# Patient Record
Sex: Male | Born: 2000
Health system: Southern US, Community
[De-identification: ages and names within clinical notes are randomized; demographics above are authoritative.]

## PROBLEM LIST (undated history)

## (undated) DIAGNOSIS — J45909 Unspecified asthma, uncomplicated: Secondary | ICD-10-CM

## (undated) DIAGNOSIS — Z87820 Personal history of traumatic brain injury: Secondary | ICD-10-CM

---
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
## 2019-03-27 DIAGNOSIS — M7711 Lateral epicondylitis, right elbow: Secondary | ICD-10-CM | POA: Diagnosis not present

## 2019-03-27 DIAGNOSIS — Z68.41 Body mass index (BMI) pediatric, less than 5th percentile for age: Secondary | ICD-10-CM | POA: Diagnosis not present

## 2019-03-27 DIAGNOSIS — M779 Enthesopathy, unspecified: Secondary | ICD-10-CM | POA: Diagnosis not present

## 2019-04-01 IMAGING — CT CT CERVICAL SPINE W/O CM
4 of 7 series · 15 of 33 positions shown, 16 images · non-contrast
Comparison: Prior CT from 09/27/2016.

CLINICAL DATA: Initial evaluation for acute trauma, fall.

EXAM:
CT HEAD WITHOUT CONTRAST
CT CERVICAL SPINE WITHOUT CONTRAST
TECHNIQUE: Multidetector CT imaging of the head and cervical spine was
performed following the standard protocol without intravenous
contrast. Multiplanar CT image reconstructions of the cervical spine
were also generated.

[Series 4: coronal soft tissue · coronal · 0.31mm/px · 3 of 80 slices shown]
[im 20/80  bone]
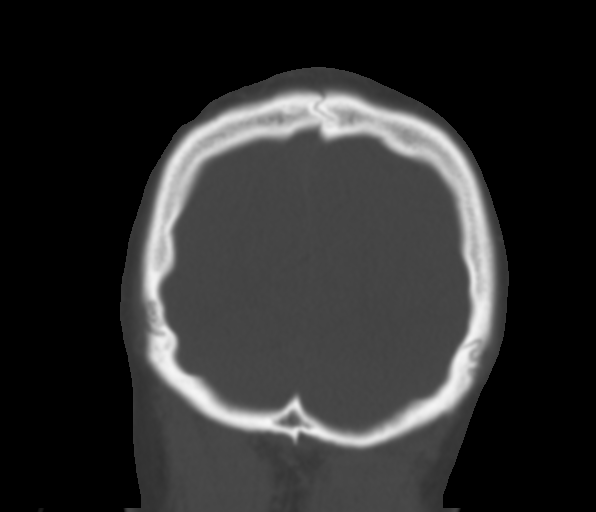
[im 40/80  bone]
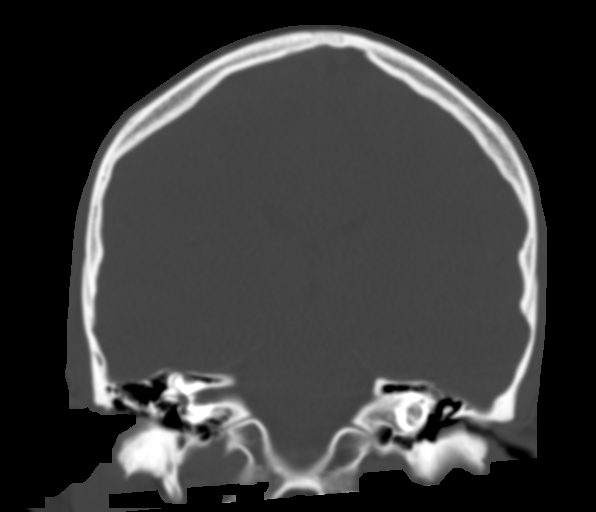
[im 60/80  bone]
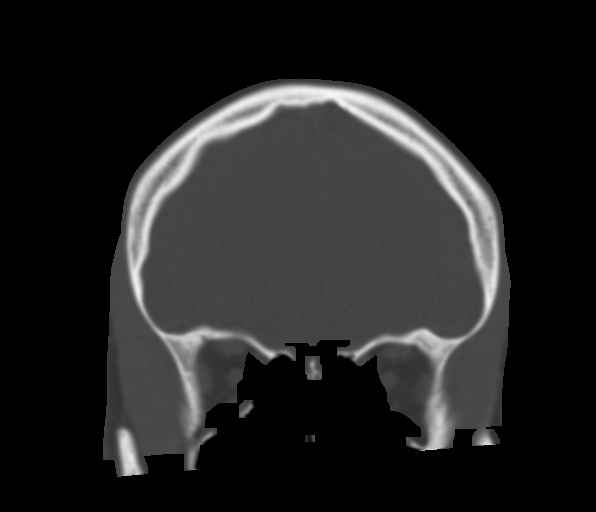

[Series 7: c spine soft · axial · 0.30mm/px · z∈[-122,-6]mm · 4 of 98 slices shown]
[im 20/98  soft-tissue]
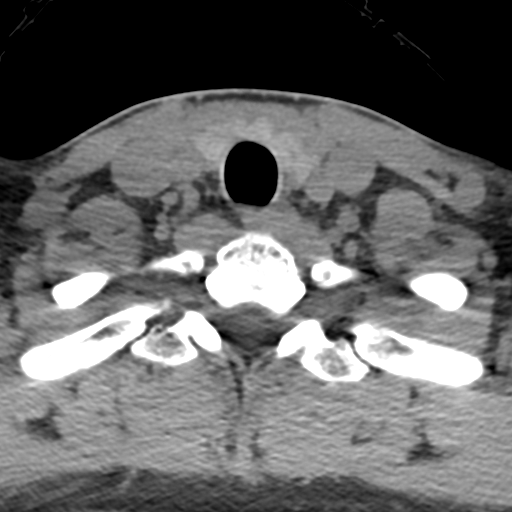
[im 39/98  soft-tissue]
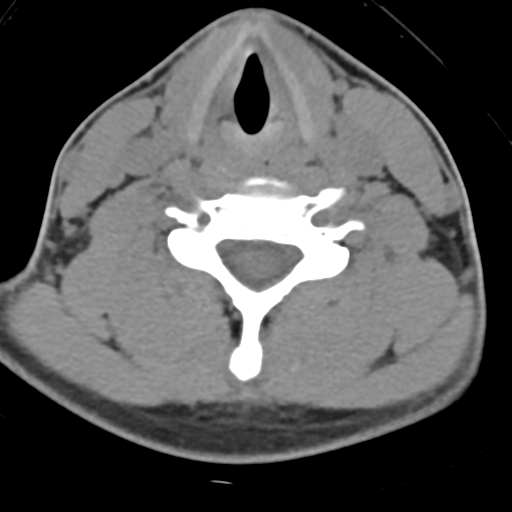
[im 59/98  soft-tissue]
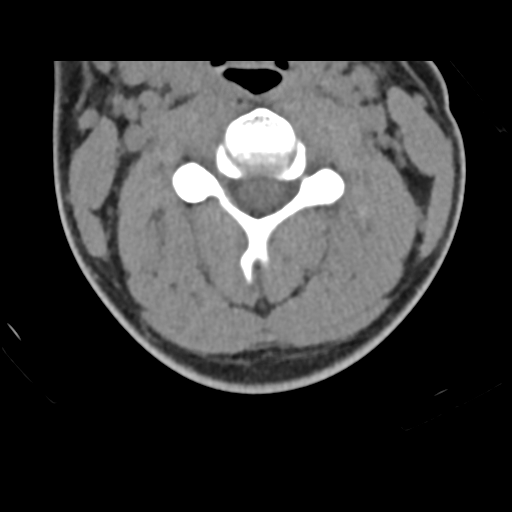
[im 78/98  soft-tissue]
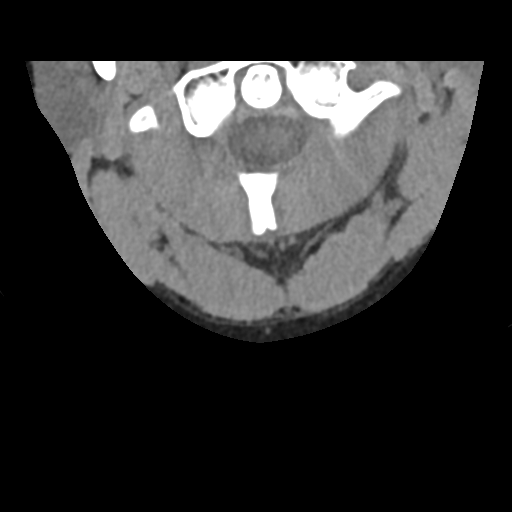

[Series 8: sagittal bone · sagittal · 0.38mm/px · 4 of 61 slices shown]
[im 13/61  bone]
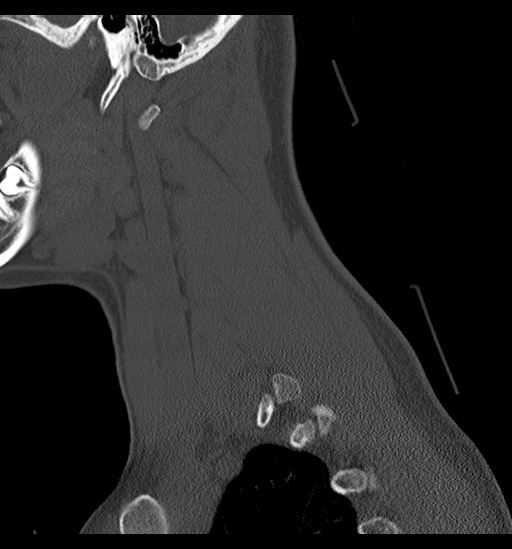
[im 25/61  bone]
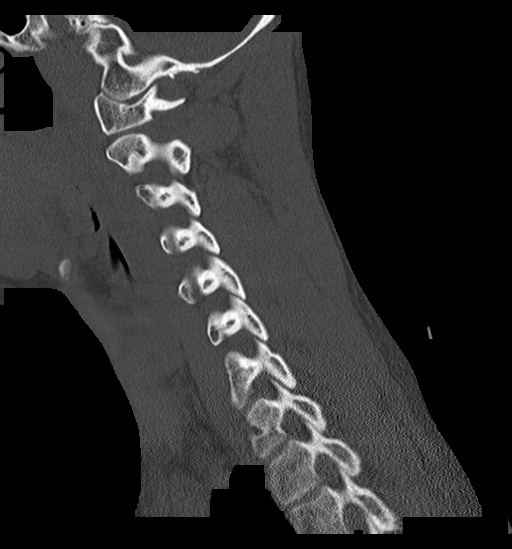
[im 37/61  bone]
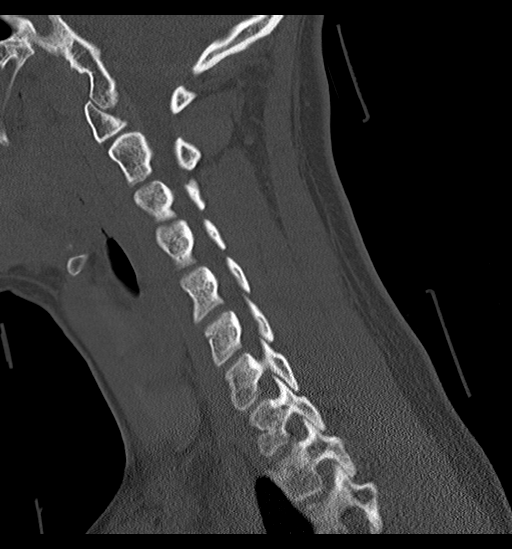
[im 49/61  bone]
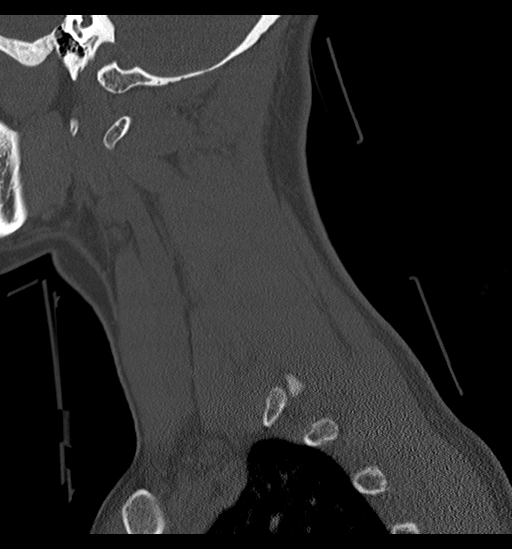

[Series 13: orthogonal bone · axial · 0.21mm/px · z∈[-139,-33]mm · 4 of 102 slices shown, 5 images]
[im 21/102  soft-tissue]
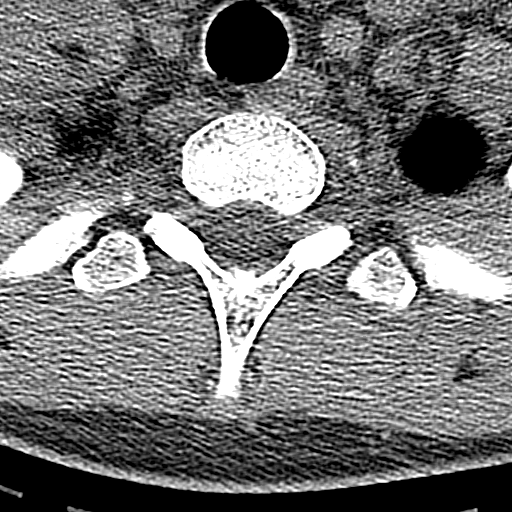
[im 21/102  bone]
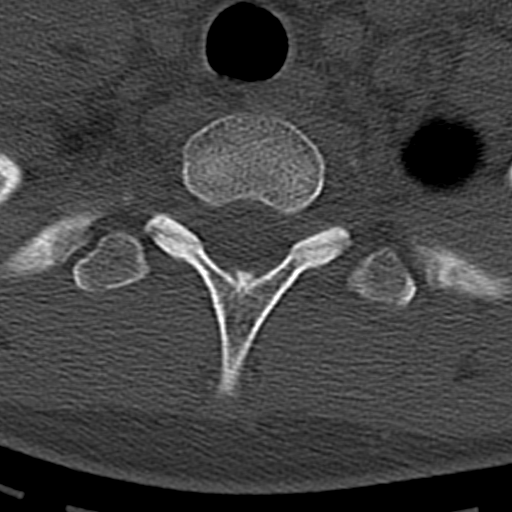
[im 41/102  bone]
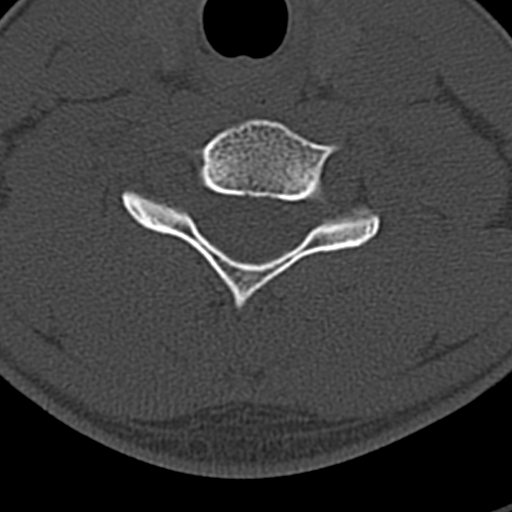
[im 61/102  bone]
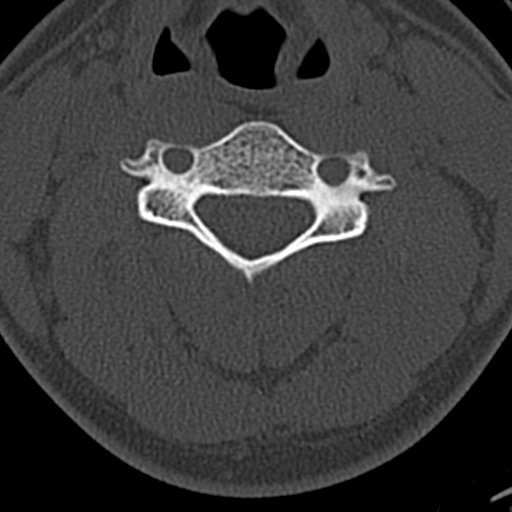
[im 81/102  bone]
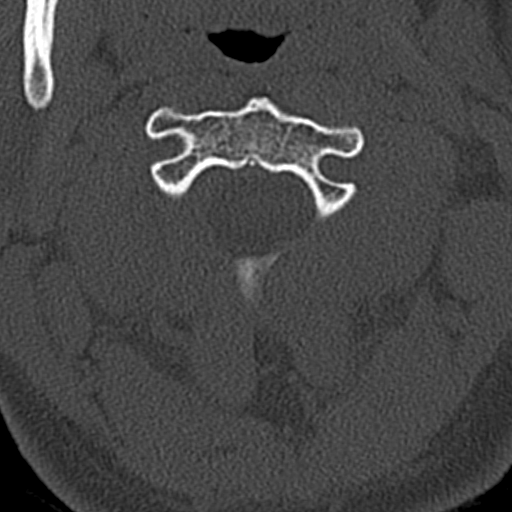

[15 of 33 positions shown; findings below may reference images not displayed]

FINDINGS: CT HEAD FINDINGS

Brain: Cerebral volume within normal limits for patient age.

No evidence for acute intracranial hemorrhage. No findings to
suggest acute large vessel territory infarct. No mass lesion,
midline shift, or mass effect. Ventricles are normal in size without
evidence for hydrocephalus. No extra-axial fluid collection
identified.

Vascular: No hyperdense vessel identified.

Skull: Scalp soft tissues demonstrate no acute abnormality.Calvarium
intact.

Sinuses/Orbits: Globes and orbital soft tissues are within normal
limits.

Visualized paranasal sinuses are clear. No mastoid effusion.

CT CERVICAL SPINE FINDINGS

Alignment: Straightening of the normal cervical lordosis. No
listhesis.

Skull base and vertebrae: Skullbase intact. Normal C1-2
articulations preserved. Dens is intact. Vertebral body heights
maintained. No acute fracture.

Soft tissues and spinal canal: Visualized soft tissues of the neck
demonstrate no acute abnormality. No prevertebral edema.

Disc levels: No significant degenerative changes within the cervical
spine.

Upper chest: Visualized upper chest is unremarkable. Visualized lung
apices are clear. No apical pneumothorax.

Other: No other significant finding.
IMPRESSION: 1. Normal head CT.  No acute intracranial process identified.
2. No acute traumatic injury within the cervical spine.
3. Straightening of the normal cervical lordosis, which may be
related to positioning and/or muscular spasm.

## 2019-06-20 ENCOUNTER — Other Ambulatory Visit: Payer: Self-pay

## 2019-06-20 DIAGNOSIS — Z20822 Contact with and (suspected) exposure to covid-19: Secondary | ICD-10-CM

## 2019-06-21 LAB — NOVEL CORONAVIRUS, NAA: SARS-CoV-2, NAA: NOT DETECTED

## 2019-06-22 ENCOUNTER — Telehealth: Payer: Self-pay | Admitting: *Deleted

## 2019-06-22 NOTE — Telephone Encounter (Signed)
Patient called and was given NEGATIVE COVID results . 

## 2019-08-05 ENCOUNTER — Other Ambulatory Visit: Payer: Self-pay

## 2019-08-05 ENCOUNTER — Ambulatory Visit
Admission: EM | Admit: 2019-08-05 | Discharge: 2019-08-05 | Disposition: A | Discharge status: Home / Self Care | Payer: BC Managed Care – PPO | Attending: Emergency Medicine | Admitting: Emergency Medicine

## 2019-08-05 ENCOUNTER — Ambulatory Visit (INDEPENDENT_AMBULATORY_CARE_PROVIDER_SITE_OTHER): Payer: BC Managed Care – PPO

## 2019-08-05 DIAGNOSIS — S82001A Unspecified fracture of right patella, initial encounter for closed fracture: Secondary | ICD-10-CM

## 2019-08-05 DIAGNOSIS — M25561 Pain in right knee: Secondary | ICD-10-CM

## 2019-08-05 MED ORDER — IBUPROFEN 800 MG PO TABS: 800.0000 mg | ORAL_TABLET | Freq: Three times a day (TID) | ORAL | 0 refills | Status: DC

## 2019-08-05 NOTE — ED Triage Notes (Signed)
Pt has chronic pain in right knee but jumped out of trick 3 days ago and heard pop, pian has worsened, some swelling noted

## 2019-08-05 NOTE — ED Provider Notes (Addendum)
[facility]    CSN: [id number] Arrival date & time: 08/05/19  1303      History   Chief Complaint Chief Complaint  Patient presents with  . Knee Injury    HPI Nicolas George is a 19 y.o. male.   Nicolas George 19 years old male presented to the urgent care with a complaint of right knee pain that began 3 days ago.  It develop gradually after jumping from a truck.  He localizes the pain to the right knee.  He describes the pain as constant and achy and rated at 4 on a scale of 1-10. Pain is at 5 or 6 on a range of motion. He has tried OTC tylenol medications without relief.  His symptoms are made worse with movement .  He denies similar symptoms in the past.    The history is provided by the patient. No language interpreter was used.    Past Medical History:  Diagnosis Date  . H/O multiple concussions     Patient Active Problem List   Diagnosis Date Noted  . Persistent headaches 11/08/2016  . Postconcussion syndrome 07/06/2016    History reviewed. No pertinent surgical history.     Home Medications    Prior to Admission medications   Medication Sig Start Date End Date Taking? Authorizing Provider  ibuprofen (ADVIL) 800 MG tablet Take 1 tablet (800 mg total) by mouth 3 (three) times daily. Take with food 08/05/19   Rissa Turley, Darrelyn Hillock, FNP  loratadine (ALLERGY RELIEF) 10 MG tablet Take 10 mg by mouth daily as needed for allergies.    [provider]  methocarbamol (ROBAXIN) 500 MG tablet Take 1 tablet (500 mg total) by mouth 3 (three) times daily. 12/21/17   Lily Kocher, PA-C  naproxen sodium (ALEVE) 220 MG tablet Take 220 mg by mouth daily as needed (FOR PAIN).    [provider]  PROAIR HFA 108 [phone] Base) MCG/ACT inhaler Inhale 1-2 puffs into the lungs every 6 (six) hours as needed for wheezing or shortness of breath.  12/08/17   [provider]  traMADol Veatrice Bourbon) 50 MG tablet 1 or 2 po q6h prn pain 12/21/17   Lily Kocher, PA-C     Family History Family History  Problem Relation Age of Onset  . Colon cancer Maternal Grandfather   . Diabetes Paternal Grandfather   . Lymphoma Brother     Social History Social History   Tobacco Use  . Smoking status: Never Smoker  . Smokeless tobacco: Never Used  Substance Use Topics  . Alcohol use: No    Alcohol/week: 0.0 standard drinks  . Drug use: No     Allergies   Patient has no known allergies.   Review of Systems Review of Systems  Constitutional: Negative.   Respiratory: Negative.   Cardiovascular: Negative.   Musculoskeletal: Negative.        Knee pain  Neurological: Negative.      Physical Exam Triage Vital Signs ED Triage Vitals  Enc Vitals Group     BP      Pulse      Resp      Temp      Temp src      SpO2      Weight      Height      Head Circumference      Peak Flow      Pain Score      Pain Loc      Pain  Edu?      Excl. in GC?    No data found.  Updated Vital Signs Pulse 69   Temp 99 F (37.2 C)   Resp 18   SpO2 96%   Visual Acuity Right Eye Distance:   Left Eye Distance:   Bilateral Distance:    Right Eye Near:   Left Eye Near:    Bilateral Near:     Physical Exam Vitals and nursing note reviewed.  Constitutional:      General: He is not in acute distress.    Appearance: He is normal weight. He is not ill-appearing or toxic-appearing.  Cardiovascular:     Rate and Rhythm: Normal rate and regular rhythm.     Pulses: Normal pulses.     Heart sounds: Normal heart sounds.  Pulmonary:     Effort: Pulmonary effort is normal. No respiratory distress.     Breath sounds: Normal breath sounds. No rhonchi.  Chest:     Chest wall: No tenderness.  Musculoskeletal:        General: Tenderness present. No swelling or signs of injury.     Right knee: No swelling, deformity, effusion, erythema or crepitus. Tenderness present. Normal alignment. Normal pulse.     Left knee: Normal.     Comments: Patient is able to bear  weight and ambulate with pain.  No surface trauma or obvious effusion.  No overlying erythema or warmth.  The right knee is without obvious asymmetry or deformity when compared to the left knee.  Patient is unable to deep knee bend.  No quadricep tenderness.  Anterior drawer test, Lachman test were negative.  Neurological:     Mental Status: He is alert.      UC Treatments / Results  Labs (all labs ordered are listed, but only abnormal results are displayed) Labs Reviewed - No data to display  EKG   Radiology DG Knee Complete 4 Views Right  Result Date: 08/05/2019 CLINICAL DATA:  Right knee pain and swelling EXAM: RIGHT KNEE - COMPLETE 4+ VIEW COMPARISON:  08/08/2015 FINDINGS: Subtle linear lucency of the cortex of the inferomedial patella seen only on oblique view potentially representing a nondisplaced fracture. Osseous structures appear otherwise intact and unremarkable. Suspect small knee joint effusion. IMPRESSION: 1. Possible subtle nondisplaced fracture of the inferomedial patella seen only on one view. Correlation with point tenderness at this site is recommended. 2. Suspect small knee joint effusion. Electronically Signed   By: Duanne Guess D.O.   On: 08/05/2019 13:42    Procedures Procedures (including critical care time)  Medications Ordered in UC Medications - No data to display  Initial Impression / Assessment and Plan / UC Course  I have reviewed the triage vital signs and the nursing notes.  Pertinent labs & imaging results that were available during my care of the patient were reviewed by me and considered in my medical decision making (see chart for details).   Right knee x-ray was ordered and result review.  Results show possible nondisplaced fracture of the inferior medial patella.  Advised patient to follow-up with orthopedic as soon as possible.  To go to ED for worsening of symptoms patient verbalized understanding of the plan of care.  Final Clinical  Impressions(s) / UC Diagnoses   Final diagnoses:  Closed nondisplaced fracture of right patella, unspecified fracture morphology, initial encounter     Discharge Instructions     Patient was advised to follow-up with orthopedic ASAP Advised to take ibuprofen with food  for pain Advised patient to follow RICE instruction To go to ED for worsening of symptoms    ED Prescriptions    Medication Sig Dispense Auth. Provider   ibuprofen (ADVIL) 800 MG tablet Take 1 tablet (800 mg total) by mouth 3 (three) times daily. Take with food 30 tablet Zenon Leaf, Zachery Dakins, FNP     PDMP not reviewed this encounter.   Durward Parcel, FNP 08/05/19 1416    Durward Parcel, FNP 08/05/19 [phone]

## 2019-08-05 NOTE — Discharge Instructions (Addendum)
Patient was advised to follow-up with orthopedic ASAP Advised to take ibuprofen with food for pain Advised patient to follow RICE instruction To go to ED for worsening of symptoms

## 2019-08-06 ENCOUNTER — Ambulatory Visit (INDEPENDENT_AMBULATORY_CARE_PROVIDER_SITE_OTHER): Payer: BC Managed Care – PPO | Admitting: Orthopedic Surgery

## 2019-08-06 ENCOUNTER — Encounter: Payer: Self-pay | Admitting: Orthopedic Surgery

## 2019-08-06 VITALS — BP 136/89 | HR 89 | Ht 71.0 in | Wt 185.0 lb

## 2019-08-06 DIAGNOSIS — S83241A Other tear of medial meniscus, current injury, right knee, initial encounter: Secondary | ICD-10-CM

## 2019-08-06 DIAGNOSIS — M25561 Pain in right knee: Secondary | ICD-10-CM | POA: Diagnosis not present

## 2019-08-06 DIAGNOSIS — W19XXXA Unspecified fall, initial encounter: Secondary | ICD-10-CM

## 2019-08-06 DIAGNOSIS — Y9239 Other specified sports and athletic area as the place of occurrence of the external cause: Secondary | ICD-10-CM | POA: Diagnosis not present

## 2019-08-06 DIAGNOSIS — S83411A Sprain of medial collateral ligament of right knee, initial encounter: Secondary | ICD-10-CM | POA: Diagnosis not present

## 2019-08-06 MED ORDER — IBUPROFEN 800 MG PO TABS: 800.0000 mg | ORAL_TABLET | Freq: Three times a day (TID) | ORAL | 1 refills | Status: DC | PRN

## 2019-08-06 NOTE — Progress Notes (Signed)
Chief Complaint  Patient presents with  . 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 felt a pop in his right knee on the medial side felt acute pain and then the pain worsened over the next couple days presented to urgent care for evaluation and treatment they did an x-ray and it was thought that he had a fractured patella as the imaging showed a possible linear fracture line  His pain is on the medial joint line and inferior to the patella  Review of systems is negative all systems were reviewed  He has a history of Osgood-Schlatter's disease at 19 years old but that resolved  No medical problems listed by the patient  BP 136/89   Pulse 89   Ht 5\' 11"  (1.803 m)   Wt 185 lb (83.9 kg)   BMI 25.80 kg/m   Awake and alert oriented x3 mood affect normal  Ambulating with crutches  Right knee Tenderness on the medial joint line Limited range of motion with poor and limited extension flexion to 90 degrees No joint effusion Extensor mechanism is intact he does have some patellar tendon tenderness McMurray's was positive Neurovascular exam is intact  Left knee skin normal range of motion normal strength normal ligaments normal neurovascular exam normal  Medical decision making   this is a new undiagnosed problem with uncertain prognosis  Independent interpretation of test and x-ray  Prescription  [facility] [place] notes are reviewed as well history confirmed radiographic report noted patient placed in a modified brace   Level 4 I reviewed the images from an outside facility MRI report is that there is a linear fracture line along the medial side which may suggest fracture or normal variant patella is otherwise centered for the remaining 4 views of the knee are normal  Impression normal right knee film  Meds ordered this encounter  Medications  . ibuprofen (ADVIL) 800 MG tablet     Sig: Take 1 tablet (800 mg total) by mouth every 8 (eight) hours as needed.    Dispense:  90 tablet    Refill:  1

## 2019-08-06 NOTE — Addendum Note (Signed)
Addended byCaffie Damme on: 08/06/2019 12:02 PM   Modules accepted: Orders

## 2019-08-06 NOTE — Patient Instructions (Signed)
Acute knee injury with possibilities of torn medial meniscus torn medial collateral ligament plus or minus patella fracture  Ice the knee 3 times a day for 20 minutes Continue with ibuprofen 800 mg 3 times a day You can add Tylenol 500 mg 4 times a day for pain  Wear the brace when you are walking you can remove it when you are sleeping you do not have to bathe in it   Out of work note next 2 weeks while MRI is being obtained and read

## 2019-08-07 ENCOUNTER — Ambulatory Visit
Admission: RE | Admit: 2019-08-07 | Discharge: 2019-08-07 | Disposition: A | Discharge status: Home / Self Care | Payer: BC Managed Care – PPO | Source: Ambulatory Visit | Attending: Orthopedic Surgery | Admitting: Orthopedic Surgery

## 2019-08-07 ENCOUNTER — Telehealth: Payer: Self-pay | Admitting: Orthopedic Surgery

## 2019-08-07 DIAGNOSIS — S83241A Other tear of medial meniscus, current injury, right knee, initial encounter: Secondary | ICD-10-CM

## 2019-08-07 DIAGNOSIS — S83411A Sprain of medial collateral ligament of right knee, initial encounter: Secondary | ICD-10-CM

## 2019-08-07 NOTE — Telephone Encounter (Signed)
Call from patient for MRI results of right knee, done at [facility]. Aware Dr Romeo Apple will call with results.

## 2019-08-08 ENCOUNTER — Telehealth: Payer: Self-pay | Admitting: Orthopedic Surgery

## 2019-08-08 NOTE — Telephone Encounter (Signed)
MRI negative  Patient says brace is helping his knee  Recommend appointment in 3 weeks

## 2019-08-08 NOTE — Telephone Encounter (Signed)
Done. Patient states he received his results.  Per message from Dr Romeo Apple: Vickki Hearing, MD    Make an appointment for 3 weeks recheck knee   - I have called patient; scheduled accordingly. Patient aware of appointment.

## 2019-08-10 ENCOUNTER — Other Ambulatory Visit: Payer: BC Managed Care – PPO

## 2019-08-29 ENCOUNTER — Other Ambulatory Visit: Payer: Self-pay

## 2019-08-29 ENCOUNTER — Encounter: Payer: Self-pay | Admitting: Orthopedic Surgery

## 2019-08-29 ENCOUNTER — Ambulatory Visit (INDEPENDENT_AMBULATORY_CARE_PROVIDER_SITE_OTHER): Payer: BC Managed Care – PPO | Admitting: Orthopedic Surgery

## 2019-08-29 VITALS — BP 123/83 | HR 67 | Ht 71.0 in | Wt 196.0 lb

## 2019-08-29 DIAGNOSIS — S83411A Sprain of medial collateral ligament of right knee, initial encounter: Secondary | ICD-10-CM

## 2019-08-29 NOTE — Progress Notes (Signed)
Chief Complaint  Patient presents with  . 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  He says he is gone back to work wants to play golf  Complains of no pain in the knee at this time  Examination reveals no effusion full range of motion stable ACL collateral ligaments no tenderness or effusion  Encounter Diagnosis  Name Primary?  . Sprain of medial collateral ligament of right knee, initial encounter Yes     Released

## 2019-10-10 DIAGNOSIS — H1013 Acute atopic conjunctivitis, bilateral: Secondary | ICD-10-CM | POA: Diagnosis not present

## 2019-11-05 ENCOUNTER — Other Ambulatory Visit: Payer: Self-pay

## 2019-11-05 ENCOUNTER — Other Ambulatory Visit: Payer: BC Managed Care – PPO

## 2019-11-05 ENCOUNTER — Ambulatory Visit: Payer: BC Managed Care – PPO | Attending: Internal Medicine

## 2019-11-05 DIAGNOSIS — Z20822 Contact with and (suspected) exposure to covid-19: Secondary | ICD-10-CM

## 2019-11-07 LAB — SARS-COV-2, NAA 2 DAY TAT

## 2019-11-07 LAB — NOVEL CORONAVIRUS, NAA: SARS-CoV-2, NAA: NOT DETECTED

## 2019-11-27 ENCOUNTER — Telehealth: Payer: BC Managed Care – PPO | Admitting: Internal Medicine

## 2019-12-03 ENCOUNTER — Ambulatory Visit: Payer: BC Managed Care – PPO | Admitting: Internal Medicine

## 2020-03-20 ENCOUNTER — Encounter: Payer: Self-pay | Admitting: Emergency Medicine

## 2020-03-20 ENCOUNTER — Other Ambulatory Visit: Payer: Self-pay

## 2020-03-20 ENCOUNTER — Ambulatory Visit
Admission: EM | Admit: 2020-03-20 | Discharge: 2020-03-20 | Disposition: A | Discharge status: Home / Self Care | Payer: BC Managed Care – PPO | Attending: Emergency Medicine | Admitting: Emergency Medicine

## 2020-03-20 DIAGNOSIS — M5412 Radiculopathy, cervical region: Secondary | ICD-10-CM | POA: Diagnosis not present

## 2020-03-20 DIAGNOSIS — M79641 Pain in right hand: Secondary | ICD-10-CM

## 2020-03-20 MED ORDER — PREDNISONE 10 MG (21) PO TBPK: ORAL_TABLET | ORAL | 0 refills | Status: DC

## 2020-03-20 MED ORDER — GABAPENTIN 100 MG PO CAPS: 100.0000 mg | ORAL_CAPSULE | Freq: Two times a day (BID) | ORAL | 0 refills | Status: DC

## 2020-03-20 MED ORDER — ACETAMINOPHEN 325 MG PO TABS: 650.0000 mg | ORAL_TABLET | Freq: Four times a day (QID) | ORAL | 0 refills | Status: AC | PRN

## 2020-03-20 NOTE — ED Triage Notes (Signed)
RT hand pain and swelling on and off for a couple of months.  The hand pain and swelling started again x 2 days ago, now the pain shoots up his arm to his RT neck area. Denies any injuries.

## 2020-03-20 NOTE — ED Provider Notes (Signed)
[facility]   [id number] 03/20/20 Arrival Time: 1048  Chief Complaint  Patient presents with  . Hand Pain     SUBJECTIVE: History from: patient.  Nicolas George is a 19 y.o. male who presented to the urgent care with a complaint of right right hand pain for the past couple month.  State pain does radiate  to his neck.  Reports off-and-on symptoms with swelling of time.  Denies any precipitating event, trauma or injury.  He localizes the pain to the right hand.  He describes the pain as constant and achy.  He has tried OTC medications without relief.  His symptoms are made worse with ROM.  He denies similar symptoms in the past.  Denies chills, fever, nausea, vomiting, diarrhea.   ROS: As per HPI.  All other pertinent ROS negative.     Past Medical History:  Diagnosis Date  . H/O multiple concussions    History reviewed. No pertinent surgical history. No Known Allergies No current facility-administered medications on file prior to encounter.   Current Outpatient Medications on File Prior to Encounter  Medication Sig Dispense Refill  . ibuprofen (ADVIL) 800 MG tablet Take 1 tablet (800 mg total) by mouth every 8 (eight) hours as needed. 90 tablet 1  . loratadine (ALLERGY RELIEF) 10 MG tablet Take 10 mg by mouth daily as needed for allergies.    Marland Kitchen PROAIR HFA 108 (90 Base) MCG/ACT inhaler Inhale 1-2 puffs into the lungs every 6 (six) hours as needed for wheezing or shortness of breath.     . traMADol (ULTRAM) 50 MG tablet 1 or 2 po q6h prn pain 12 tablet 0   Social History   Socioeconomic History  . Marital status: Single    Spouse name: Not on file  . Number of children: Not on file  . Years of education: Not on file  . Highest education level: Not on file  Occupational History  . Not on file  Tobacco Use  . Smoking status: Never Smoker  . Smokeless tobacco: Never Used  Vaping Use  . Vaping Use: Never used  Substance and Sexual Activity  . Alcohol use: No     Alcohol/week: 0.0 standard drinks  . Drug use: No  . Sexual activity: Never  Other Topics Concern  . 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 He does well in school.   Lives with parents and siblings.    Social Determinants of Health   Financial Resource Strain:   . Difficulty of Paying Living Expenses: Not on file  Food Insecurity:   . Worried About Programme researcher, broadcasting/film/video in the Last Year: Not on file  . Ran Out of Food in the Last Year: Not on file  Transportation Needs:   . Lack of Transportation (Medical): Not on file  . Lack of Transportation (Non-Medical): Not on file  Physical Activity:   . Days of Exercise per Week: Not on file  . Minutes of Exercise per Session: Not on file  Stress:   . Feeling of Stress : Not on file  Social Connections:   . Frequency of Communication with Friends and Family: Not on file  . Frequency of Social Gatherings with Friends and Family: Not on file  . Attends Religious Services: Not on file  . Active Member of Clubs or Organizations: Not on file  . Attends Banker Meetings: Not on file  .  Marital Status: Not on file  Intimate Partner Violence:   . Fear of Current or Ex-Partner: Not on file  . Emotionally Abused: Not on file  . Physically Abused: Not on file  . Sexually Abused: Not on file   Family History  Problem Relation Age of Onset  . Colon cancer Maternal Grandfather   . Diabetes Paternal Grandfather   . Lymphoma Brother     OBJECTIVE:  Vitals:   03/20/20 1109 03/20/20 1111  BP: 131/90   Pulse: (!) 57   Resp: 18   Temp: 98.3 F (36.8 C)   TempSrc: Oral   SpO2: 96%   Weight:  180 lb (81.6 kg)  Height:  5\' 11"  (1.803 m)     Physical Exam Vitals and nursing note reviewed.  Constitutional:      General: He is not in acute distress.    Appearance: Normal appearance. He is normal weight. He is not ill-appearing, toxic-appearing or diaphoretic.    Cardiovascular:     Rate and Rhythm: Normal rate and regular rhythm.     Pulses: Normal pulses.     Heart sounds: Normal heart sounds. No murmur heard.  No friction rub. No gallop.   Pulmonary:     Effort: Pulmonary effort is normal. No respiratory distress.     Breath sounds: Normal breath sounds. No stridor. No wheezing, rhonchi or rales.  Chest:     Chest wall: No tenderness.  Musculoskeletal:        General: Tenderness present.     Right hand: Tenderness present.     Left hand: Normal.     Comments: The right hand is without any obvious asymmetry or deformity compared to the leg pain.  No surface trauma, ecchymosis, lesion, amputation, hematoma.  Normal flexion/extension, eversion/inversion of the hand.  Neurovascular status intact.  Neurological:     Mental Status: He is alert and oriented to person, place, and time.      LABS:  No results found for this or any previous visit (from the past 24 hour(s)).   ASSESSMENT & PLAN:  1. Cervical radiculopathy   2. Right hand pain     Meds ordered this encounter  Medications  . acetaminophen (TYLENOL) 325 MG tablet    Sig: Take 2 tablets (650 mg total) by mouth every 6 (six) hours as needed.    Dispense:  30 tablet    Refill:  0  . gabapentin (NEURONTIN) 100 MG capsule    Sig: Take 1 capsule (100 mg total) by mouth 2 (two) times daily.    Dispense:  30 capsule    Refill:  0  . predniSONE (STERAPRED UNI-PAK 21 TAB) 10 MG (21) TBPK tablet    Sig: Take 6 tabs by mouth daily  for 1 days, then 5 tabs for 1 days, then 4 tabs for 1 days, then 3 tabs for 1 days, 2 tabs for 1 days, then 1 tab by mouth daily for 1 days    Dispense:  21 tablet    Refill:  0    Discharge Instructions Tylenol as prescribed for pain Gabapentin was prescribed for tingling numbness Prednisone was prescribed to decrease inflammation Follow-up with PCP Return or go to ED for worsening of symptoms  Reviewed expectations re: course of current medical  issues. Questions answered. Outlined signs and symptoms indicating need for more acute intervention. Patient verbalized understanding. After Visit Summary given.      Note: This document was prepared using  and may include unintentional dictation errors.    Durward Parcel, FNP 03/20/20 1207

## 2020-03-20 NOTE — Discharge Instructions (Addendum)
Tylenol as prescribed for pain Gabapentin was prescribed for tingling numbness Prednisone was prescribed to decrease inflammation Follow RICE instruction that is attached Follow-up with PCP Return or go to ED for worsening of symptoms

## 2020-05-09 ENCOUNTER — Other Ambulatory Visit: Payer: Self-pay

## 2020-05-09 ENCOUNTER — Other Ambulatory Visit: Payer: BC Managed Care – PPO

## 2020-05-09 DIAGNOSIS — Z20822 Contact with and (suspected) exposure to covid-19: Secondary | ICD-10-CM | POA: Diagnosis not present

## 2020-05-10 LAB — SARS-COV-2, NAA 2 DAY TAT

## 2020-05-10 LAB — NOVEL CORONAVIRUS, NAA: SARS-CoV-2, NAA: NOT DETECTED

## 2020-06-09 DIAGNOSIS — J45909 Unspecified asthma, uncomplicated: Secondary | ICD-10-CM | POA: Diagnosis not present

## 2020-06-10 ENCOUNTER — Other Ambulatory Visit: Payer: Self-pay

## 2020-06-10 ENCOUNTER — Other Ambulatory Visit (HOSPITAL_COMMUNITY): Payer: Self-pay | Admitting: Family Medicine

## 2020-06-10 ENCOUNTER — Ambulatory Visit (HOSPITAL_COMMUNITY)
Admission: RE | Admit: 2020-06-10 | Discharge: 2020-06-10 | Disposition: A | Discharge status: Home / Self Care | Payer: BC Managed Care – PPO | Source: Ambulatory Visit | Attending: Family Medicine | Admitting: Family Medicine

## 2020-06-10 DIAGNOSIS — Z68.41 Body mass index (BMI) pediatric, less than 5th percentile for age: Secondary | ICD-10-CM | POA: Diagnosis not present

## 2020-06-10 DIAGNOSIS — R079 Chest pain, unspecified: Secondary | ICD-10-CM | POA: Diagnosis not present

## 2020-06-10 DIAGNOSIS — R0602 Shortness of breath: Secondary | ICD-10-CM | POA: Insufficient documentation

## 2020-06-10 DIAGNOSIS — R0600 Dyspnea, unspecified: Secondary | ICD-10-CM | POA: Diagnosis not present

## 2020-06-10 DIAGNOSIS — Z1331 Encounter for screening for depression: Secondary | ICD-10-CM | POA: Diagnosis not present

## 2020-06-10 DIAGNOSIS — R0609 Other forms of dyspnea: Secondary | ICD-10-CM | POA: Diagnosis not present

## 2020-09-11 ENCOUNTER — Encounter: Payer: Self-pay | Admitting: *Deleted

## 2020-09-11 NOTE — Progress Notes (Deleted)
Cardiology Office Note  Date: 09/11/2020   ID: Nicolas George, DOB [birth year], MRN [id number]  PCP:  Assunta Found, MD  Cardiologist:  Nona Dell, MD Electrophysiologist:  None   No chief complaint on file.   History of Present Illness: Nicolas George is a 20 y.o. male referred for cardiology consultation by Dr. Phillips Odor for the evaluation of shortness of breath and chest pain.  Past Medical History:  Diagnosis Date  . H/O multiple concussions     No past surgical history on file.  Current Outpatient Medications  Medication Sig Dispense Refill  . acetaminophen (TYLENOL) 325 MG tablet Take 2 tablets (650 mg total) by mouth every 6 (six) hours as needed. 30 tablet 0  . gabapentin (NEURONTIN) 100 MG capsule Take 1 capsule (100 mg total) by mouth 2 (two) times daily. 30 capsule 0  . ibuprofen (ADVIL) 800 MG tablet Take 1 tablet (800 mg total) by mouth every 8 (eight) hours as needed. 90 tablet 1  . loratadine (ALLERGY RELIEF) 10 MG tablet Take 10 mg by mouth daily as needed for allergies.    . predniSONE (STERAPRED UNI-PAK 21 TAB) 10 MG (21) TBPK tablet Take 6 tabs by mouth daily  for 1 days, then 5 tabs for 1 days, then 4 tabs for 1 days, then 3 tabs for 1 days, 2 tabs for 1 days, then 1 tab by mouth daily for 1 days 21 tablet 0  . PROAIR HFA 108 (90 Base) MCG/ACT inhaler Inhale 1-2 puffs into the lungs every 6 (six) hours as needed for wheezing or shortness of breath.     . traMADol (ULTRAM) 50 MG tablet 1 or 2 po q6h prn pain 12 tablet 0   No current facility-administered medications for this visit.   Allergies:  Patient has no known allergies.   Social History: The patient  reports that he has never smoked. He has never used smokeless tobacco. He reports that he does not drink alcohol and does not use drugs.   Family History: The patient's family history includes Colon cancer in his maternal grandfather; Diabetes in his paternal grandfather; Lymphoma in his brother.    ROS:  Please see the history of present illness. Otherwise, complete review of systems is positive for {NONE DEFAULTED:18576::"none"}.  All other systems are reviewed and negative.   Physical Exam: VS:  There were no vitals taken for this visit., BMI There is no height or weight on file to calculate BMI.  Wt Readings from Last 3 Encounters:  03/20/20 180 lb (81.6 kg)  08/29/19 196 lb (88.9 kg)  08/06/19 185 lb (83.9 kg) (84 %, Z= 0.99)*   * Growth percentiles are based on CDC (Boys, 2-20 Years) data.    General: Patient appears comfortable at rest. HEENT: Conjunctiva and lids normal, oropharynx clear with moist mucosa. Neck: Supple, no elevated JVP or carotid bruits, no thyromegaly. Lungs: Clear to auscultation, nonlabored breathing at rest. Cardiac: Regular rate and rhythm, no S3 or significant systolic murmur, no pericardial rub. Abdomen: Soft, nontender, no hepatomegaly, bowel sounds present, no guarding or rebound. Extremities: No pitting edema, distal pulses 2+. Skin: Warm and dry. Musculoskeletal: No kyphosis. Neuropsychiatric: Alert and oriented x3, affect grossly appropriate.  ECG:  An ECG dated 08/25/2018 was personally reviewed today and demonstrated:  Sinus tachycardia with left atrial enlargement, vertical axis and diffuse nonspecific ST-T wave changes.  Recent Labwork:  No recent lab work for review today.  Other Studies Reviewed Today:  Chest x-ray 06/10/2020: FINDINGS:  The heart size and mediastinal contours are within normal limits. Both lungs are clear. The visualized skeletal structures are unremarkable.  IMPRESSION: No active cardiopulmonary disease.  Assessment and Plan:    Medication Adjustments/Labs and Tests Ordered: Current medicines are reviewed at length with the patient today.  Concerns regarding medicines are outlined above.   Tests Ordered: No orders of the defined types were placed in this encounter.   Medication Changes: No orders  of the defined types were placed in this encounter.   Disposition:  Follow up {follow up:15908}  Signed, Jonelle Sidle, MD, [facility] 09/11/2020 5:17 PM    [facility] [address] [place], [place], [place] [address] Phone: [phone]; Fax: [phone]

## 2020-09-12 ENCOUNTER — Ambulatory Visit: Payer: BC Managed Care – PPO | Admitting: Cardiology

## 2020-09-12 DIAGNOSIS — R0602 Shortness of breath: Secondary | ICD-10-CM

## 2020-12-17 ENCOUNTER — Other Ambulatory Visit: Payer: Self-pay

## 2020-12-17 ENCOUNTER — Encounter: Payer: Self-pay | Admitting: Emergency Medicine

## 2020-12-17 ENCOUNTER — Ambulatory Visit
Admission: EM | Admit: 2020-12-17 | Discharge: 2020-12-17 | Disposition: A | Discharge status: Home / Self Care | Payer: BC Managed Care – PPO | Attending: Family Medicine | Admitting: Family Medicine

## 2020-12-17 DIAGNOSIS — J4551 Severe persistent asthma with (acute) exacerbation: Secondary | ICD-10-CM | POA: Diagnosis not present

## 2020-12-17 HISTORY — DX: Unspecified asthma, uncomplicated: J45.909

## 2020-12-17 MED ORDER — METHYLPREDNISOLONE SODIUM SUCC 125 MG IJ SOLR
125.0000 mg | Freq: Once | INTRAMUSCULAR | Status: AC
  Administered 2020-12-17: 125 mg via INTRAMUSCULAR

## 2020-12-17 MED ORDER — ALBUTEROL SULFATE HFA 108 (90 BASE) MCG/ACT IN AERS: 2.0000 | INHALATION_SPRAY | RESPIRATORY_TRACT | 0 refills | Status: AC | PRN

## 2020-12-17 MED ORDER — MONTELUKAST SODIUM 10 MG PO TABS: 10.0000 mg | ORAL_TABLET | Freq: Every day | ORAL | 2 refills | Status: DC

## 2020-12-17 MED ORDER — BENZONATATE 100 MG PO CAPS: 100.0000 mg | ORAL_CAPSULE | Freq: Three times a day (TID) | ORAL | 0 refills | Status: DC

## 2020-12-17 MED ORDER — PREDNISONE 10 MG (21) PO TBPK: ORAL_TABLET | Freq: Every day | ORAL | 0 refills | Status: AC

## 2020-12-17 NOTE — ED Triage Notes (Addendum)
Wheezing and chest tightness x 2 days. Has been using albuterol inhaler with no relief.

## 2020-12-17 NOTE — Discharge Instructions (Addendum)
I have sent in singulair for you to take once daily   I have sent in a prednisone taper for you to take for 6 days. 6 tablets on day one, 5 tablets on day two, 4 tablets on day three, 3 tablets on day four, 2 tablets on day five, and 1 tablet on day six.  I have sent in tessalon perles for you to use one capsule every 8 hours as needed for cough.  Follow up with this office or with primary care if symptoms are persisting.  Follow up in the ER for high fever, trouble swallowing, trouble breathing, other concerning symptoms.

## 2020-12-17 NOTE — ED Provider Notes (Signed)
[facility]    CSN: [id number] Arrival date & time: 12/17/20  1696      History   Chief Complaint Chief Complaint  Patient presents with  . Wheezing    HPI Nicolas George is a 20 y.o. male.   Reports wheezing and chest tightness for the last 2 days.  Has been using albuterol inhaler with no relief.  Reports that he works in [employer] outside.  States that he had to climb scaffolding and was very lightheaded and dizzy when he got to the top yesterday.  Reports that he used about 40 puffs of his albuterol inhaler yesterday with no relief.  Denies sick contacts.  Has negative history of COVID.  Has not completed COVID vaccines.  Has not completed flu vaccine.  Denies abdominal pain, nausea, vomiting, diarrhea, rash, fever, other symptoms.  ROS per HPI  The history is provided by the patient.  Wheezing   Past Medical History:  Diagnosis Date  . Asthma   . H/O multiple concussions     Patient Active Problem List   Diagnosis Date Noted  . Persistent headaches 11/08/2016  . Postconcussion syndrome 07/06/2016    History reviewed. No pertinent surgical history.     Home Medications    Prior to Admission medications   Medication Sig Start Date End Date Taking? Authorizing Provider  albuterol (VENTOLIN HFA) 108 (90 Base) MCG/ACT inhaler Inhale 2 puffs into the lungs every 4 (four) hours as needed for wheezing or shortness of breath. 12/17/20  Yes Moshe Cipro, NP  benzonatate (TESSALON) 100 MG capsule Take 1 capsule (100 mg total) by mouth every 8 (eight) hours. 12/17/20  Yes Moshe Cipro, NP  montelukast (SINGULAIR) 10 MG tablet Take 1 tablet (10 mg total) by mouth at bedtime. 12/17/20  Yes Moshe Cipro, NP  predniSONE (STERAPRED UNI-PAK 21 TAB) 10 MG (21) TBPK tablet Take by mouth daily for 6 days. Take 6 tablets on day 1, 5 tablets on day 2, 4 tablets on day 3, 3 tablets on day 4, 2 tablets on day 5, 1 tablet on day 6 12/17/20 12/23/20 Yes  Moshe Cipro, NP  acetaminophen (TYLENOL) 325 MG tablet Take 2 tablets (650 mg total) by mouth every 6 (six) hours as needed. 03/20/20   Avegno, Zachery Dakins, FNP  gabapentin (NEURONTIN) 100 MG capsule Take 1 capsule (100 mg total) by mouth 2 (two) times daily. 03/20/20   Avegno, Zachery Dakins, FNP  ibuprofen (ADVIL) 800 MG tablet Take 1 tablet (800 mg total) by mouth every 8 (eight) hours as needed. 08/06/19   Vickki Hearing, MD  loratadine (ALLERGY RELIEF) 10 MG tablet Take 10 mg by mouth daily as needed for allergies.    [provider]  traMADol Janean Sark) 50 MG tablet 1 or 2 po q6h prn pain 12/21/17   Ivery Quale, PA-C    Family History Family History  Problem Relation Age of Onset  . Colon cancer Maternal Grandfather   . Diabetes Paternal Grandfather   . Lymphoma Brother     Social History Social History   Tobacco Use  . Smoking status: Current Every Day Smoker    Packs/day: 0.50    Types: Cigarettes  . Smokeless tobacco: Never Used  Vaping Use  . Vaping Use: Never used  Substance Use Topics  . Alcohol use: No    Alcohol/week: 0.0 standard drinks  . Drug use: No     Allergies   Patient has no known allergies.   Review of Systems  Review of Systems  Respiratory: Positive for wheezing.      Physical Exam Triage Vital Signs ED Triage Vitals  Enc Vitals Group     BP 12/17/20 1032 (!) 146/92     Pulse Rate 12/17/20 1032 97     Resp 12/17/20 1032 20     Temp 12/17/20 1032 98.2 F (36.8 C)     Temp Source 12/17/20 1032 Oral     SpO2 12/17/20 1032 93 %     Weight --      Height --      Head Circumference --      Peak Flow --      Pain Score 12/17/20 1030 0     Pain Loc --      Pain Edu? --      Excl. in GC? --    No data found.  Updated Vital Signs BP (!) 146/92 (BP Location: Right Arm)   Pulse 97   Temp 98.2 F (36.8 C) (Oral)   Resp 20   SpO2 93%   Visual Acuity Right Eye Distance:   Left Eye Distance:   Bilateral Distance:     Right Eye Near:   Left Eye Near:    Bilateral Near:     Physical Exam Vitals and nursing note reviewed.  Constitutional:      General: He is not in acute distress.    Appearance: Normal appearance. He is well-developed and normal weight. He is not ill-appearing.  HENT:     Head: Normocephalic and atraumatic.     Nose: Nose normal.     Mouth/Throat:     Mouth: Mucous membranes are moist.     Pharynx: Oropharynx is clear. Posterior oropharyngeal erythema present.  Eyes:     Extraocular Movements: Extraocular movements intact.     Conjunctiva/sclera: Conjunctivae normal.     Pupils: Pupils are equal, round, and reactive to light.  Cardiovascular:     Rate and Rhythm: Normal rate and regular rhythm.     Heart sounds: Normal heart sounds. No murmur heard.   Pulmonary:     Effort: Pulmonary effort is normal. No respiratory distress.     Breath sounds: No stridor. Wheezing (Diffuse to bilateral lungs) present. No rhonchi or rales.     Comments: Dry cough in office Chest:     Chest wall: No tenderness.  Abdominal:     Palpations: Abdomen is soft.     Tenderness: There is no abdominal tenderness.  Musculoskeletal:        General: Normal range of motion.     Cervical back: Normal range of motion and neck supple.  Skin:    General: Skin is warm and dry.     Capillary Refill: Capillary refill takes less than 2 seconds.  Neurological:     General: No focal deficit present.     Mental Status: He is alert and oriented to person, place, and time.  Psychiatric:        Mood and Affect: Mood normal.        Behavior: Behavior normal.        Thought Content: Thought content normal.      UC Treatments / Results  Labs (all labs ordered are listed, but only abnormal results are displayed) Labs Reviewed - No data to display  EKG   Radiology No results found.  Procedures Procedures (including critical care time)  Medications Ordered in UC Medications  methylPREDNISolone  sodium succinate (SOLU-MEDROL) 125 mg/2 mL injection 125  mg (125 mg Intramuscular Given 12/17/20 1042)    Initial Impression / Assessment and Plan / UC Course  I have reviewed the triage vital signs and the nursing notes.  Pertinent labs & imaging results that were available during my care of the patient were reviewed by me and considered in my medical decision making (see chart for details).    Severe persistent asthma with acute exacerbation  Solu-Medrol 125 mg given in office today IM Prescribe Singulair to take once daily Prednisone taper prescribed Tessalon Perles prescribed Work note prescribed Follow up with this office or with primary care if symptoms are persisting.  Follow up in the ER for high fever, trouble swallowing, trouble breathing, other concerning symptoms.    Final Clinical Impressions(s) / UC Diagnoses   Final diagnoses:  Severe persistent asthma with acute exacerbation     Discharge Instructions     I have sent in singulair for you to take once daily   I have sent in a prednisone taper for you to take for 6 days. 6 tablets on day one, 5 tablets on day two, 4 tablets on day three, 3 tablets on day four, 2 tablets on day five, and 1 tablet on day six.  I have sent in tessalon perles for you to use one capsule every 8 hours as needed for cough.  Follow up with this office or with primary care if symptoms are persisting.  Follow up in the ER for high fever, trouble swallowing, trouble breathing, other concerning symptoms.       ED Prescriptions    Medication Sig Dispense Auth. Provider   albuterol (VENTOLIN HFA) 108 (90 Base) MCG/ACT inhaler Inhale 2 puffs into the lungs every 4 (four) hours as needed for wheezing or shortness of breath. 18 g Moshe Cipro, NP   predniSONE (STERAPRED UNI-PAK 21 TAB) 10 MG (21) TBPK tablet Take by mouth daily for 6 days. Take 6 tablets on day 1, 5 tablets on day 2, 4 tablets on day 3, 3 tablets on day 4, 2  tablets on day 5, 1 tablet on day 6 21 tablet Moshe Cipro, NP   benzonatate (TESSALON) 100 MG capsule Take 1 capsule (100 mg total) by mouth every 8 (eight) hours. 21 capsule Moshe Cipro, NP   montelukast (SINGULAIR) 10 MG tablet Take 1 tablet (10 mg total) by mouth at bedtime. 30 tablet Moshe Cipro, NP     PDMP not reviewed this encounter.   Moshe Cipro, NP 12/17/20 1054

## 2021-07-09 ENCOUNTER — Encounter: Payer: Self-pay | Admitting: Emergency Medicine

## 2021-07-09 ENCOUNTER — Other Ambulatory Visit: Payer: Self-pay

## 2021-07-09 ENCOUNTER — Ambulatory Visit
Admission: EM | Admit: 2021-07-09 | Discharge: 2021-07-09 | Disposition: A | Discharge status: Home / Self Care | Payer: BC Managed Care – PPO | Attending: Emergency Medicine | Admitting: Emergency Medicine

## 2021-07-09 DIAGNOSIS — Z20828 Contact with and (suspected) exposure to other viral communicable diseases: Secondary | ICD-10-CM

## 2021-07-09 DIAGNOSIS — R051 Acute cough: Secondary | ICD-10-CM

## 2021-07-09 DIAGNOSIS — B9789 Other viral agents as the cause of diseases classified elsewhere: Secondary | ICD-10-CM | POA: Diagnosis not present

## 2021-07-09 DIAGNOSIS — J988 Other specified respiratory disorders: Secondary | ICD-10-CM

## 2021-07-09 DIAGNOSIS — Z20822 Contact with and (suspected) exposure to covid-19: Secondary | ICD-10-CM

## 2021-07-09 MED ORDER — AEROCHAMBER PLUS MISC: 2 refills | Status: AC

## 2021-07-09 MED ORDER — FLUTICASONE PROPIONATE 50 MCG/ACT NA SUSP: 2.0000 | Freq: Every day | NASAL | 0 refills | Status: DC

## 2021-07-09 MED ORDER — IBUPROFEN 600 MG PO TABS: 600.0000 mg | ORAL_TABLET | Freq: Four times a day (QID) | ORAL | 0 refills | Status: AC | PRN

## 2021-07-09 MED ORDER — OSELTAMIVIR PHOSPHATE 75 MG PO CAPS: 75.0000 mg | ORAL_CAPSULE | Freq: Two times a day (BID) | ORAL | 0 refills | Status: DC

## 2021-07-09 NOTE — ED Provider Notes (Signed)
HPI  SUBJECTIVE:  Nicolas George is a 20 y.o. male who presents with the acute onset of body aches, headache, nasal congestion, rhinorrhea, postnasal drip, sore throat, cough productive of mucus, wheezing, shortness of breath, dyspnea on exertion, chest tightness, and diarrhea.  He was recently exposed to influenza.  States that he has felt feverish, but did not measure it.  No loss of sense of smell or taste, nausea, vomiting, abdominal pain.  No known COVID exposure.  He did not get the COVID or flu vaccines.  No aggravating or alleviating factors.  He has not tried anything for this.  No antipyretic in the past 6 hours.  He has a past medical history of asthma, was intubated with influenza as a child.  [id number], Nicolas Ruiz, MD   Past Medical History:  Diagnosis Date   Asthma    H/O multiple concussions     History reviewed. No pertinent surgical history.  Family History  Problem Relation Age of Onset   Colon cancer Maternal Grandfather    Diabetes Paternal Grandfather    Lymphoma Brother     Social History   Tobacco Use   Smoking status: Every Day    Packs/day: 0.50    Types: Cigarettes   Smokeless tobacco: Never  Vaping Use   Vaping Use: Never used  Substance Use Topics   Alcohol use: No    Alcohol/week: 0.0 standard drinks   Drug use: No    No current facility-administered medications for this encounter.  Current Outpatient Medications:    fluticasone (FLONASE) 50 MCG/ACT nasal spray, Place 2 sprays into both nostrils daily., Disp: 16 g, Rfl: 0   ibuprofen (ADVIL) 600 MG tablet, Take 1 tablet (600 mg total) by mouth every 6 (six) hours as needed., Disp: 30 tablet, Rfl: 0   oseltamivir (TAMIFLU) 75 MG capsule, Take 1 capsule (75 mg total) by mouth 2 (two) times daily. X 5 days, Disp: 10 capsule, Rfl: 0   Spacer/Aero-Holding Chambers (AEROCHAMBER PLUS) inhaler, Use with inhaler, Disp: 1 each, Rfl: 2   acetaminophen (TYLENOL) 325 MG tablet, Take 2 tablets (650 mg total) by  mouth every 6 (six) hours as needed., Disp: 30 tablet, Rfl: 0   albuterol (VENTOLIN HFA) 108 (90 Base) MCG/ACT inhaler, Inhale 2 puffs into the lungs every 4 (four) hours as needed for wheezing or shortness of breath., Disp: 18 g, Rfl: 0   gabapentin (NEURONTIN) 100 MG capsule, Take 1 capsule (100 mg total) by mouth 2 (two) times daily., Disp: 30 capsule, Rfl: 0   montelukast (SINGULAIR) 10 MG tablet, Take 1 tablet (10 mg total) by mouth at bedtime., Disp: 30 tablet, Rfl: 2   traMADol (ULTRAM) 50 MG tablet, 1 or 2 po q6h prn pain, Disp: 12 tablet, Rfl: 0  No Known Allergies   ROS  As noted in HPI.   Physical Exam  BP 130/75 (BP Location: Right Arm)   Pulse 96   Temp 99.5 F (37.5 C) (Oral)   Resp 16   SpO2 98%   Constitutional: Well developed, well nourished, no acute distress Eyes:  EOMI, conjunctiva normal bilaterally HENT: Normocephalic, atraumatic,mucus membranes moist.  Nasal congestion. Neck: No cervical lymphadenopathy Respiratory: Normal inspiratory effort, lungs clear bilaterally Cardiovascular: Normal rate, regular rhythm no murmurs rubs gallops GI: nondistended skin: No rash, skin intact Musculoskeletal: no deformities Neurologic: Alert & oriented x 3, no focal neuro deficits Psychiatric: Speech and behavior appropriate   ED Course   Medications - No data to display  Orders  Placed This Encounter  Procedures   Covid-19, Flu A+B ([facility])    Standing Status:   Standing    Number of Occurrences:   1    No results found for this or any previous visit (from the past 24 hour(s)). No results found.  ED Clinical Impression  1. Viral respiratory infection   2. Acute cough   3. Exposure to influenza   4. Encounter for laboratory testing for COVID-19 virus      ED Assessment/Plan  Concern for influenza given recent exposure and symptoms.  Starting on Tamiflu today.  Sending off COVID and flu.  If flu is negative, patient will discontinue Tamiflu.  If COVID  is positive, will prescribe Molnupiravir.  He has MyChart.  Regularly scheduled albuterol with a spacer, Flonase, saline nasal irrigation, Mucinex D, Tylenol/ibuprofen together 3-4 times a day.  Follow-up with PMD as needed.  ER return precautions given.  COVID, flu pending at the time of initial signing of this note.    Meds ordered this encounter  Medications   fluticasone (FLONASE) 50 MCG/ACT nasal spray    Sig: Place 2 sprays into both nostrils daily.    Dispense:  16 g    Refill:  0   Spacer/Aero-Holding Chambers (AEROCHAMBER PLUS) inhaler    Sig: Use with inhaler    Dispense:  1 each    Refill:  2    Please educate patient on use   ibuprofen (ADVIL) 600 MG tablet    Sig: Take 1 tablet (600 mg total) by mouth every 6 (six) hours as needed.    Dispense:  30 tablet    Refill:  0   oseltamivir (TAMIFLU) 75 MG capsule    Sig: Take 1 capsule (75 mg total) by mouth 2 (two) times daily. X 5 days    Dispense:  10 capsule    Refill:  0      *This clinic note was created using Scientist, clinical (histocompatibility and immunogenetics). Therefore, there may be occasional mistakes despite careful proofreading.  ?    Domenick Gong, MD 07/09/21 1146

## 2021-07-09 NOTE — Discharge Instructions (Addendum)
If flu is negative, discontinue Tamiflu.  If COVID is positive, will prescribe Molnupiravir.  2 puffs from your albuterol inhaler using your spacer every 4 hours for 2 days, and then every 6 hours for 2 days, then as needed.  Back off on the albuterol if you start to feel better. Flonase, saline nasal irrigation with a [employer]Med sinus rinse and distilled water as often as you want, Mucinex D, 1000 mg Tylenol/600 mg ibuprofen together 3-4 times a day.

## 2021-07-09 NOTE — ED Triage Notes (Signed)
Pt presents with body aches, cough, headache, and chills that started last night.

## 2021-07-10 LAB — COVID-19, FLU A+B NAA
Influenza A, NAA: NOT DETECTED
Influenza B, NAA: NOT DETECTED
SARS-CoV-2, NAA: NOT DETECTED

## 2021-10-30 DIAGNOSIS — J309 Allergic rhinitis, unspecified: Secondary | ICD-10-CM | POA: Diagnosis not present

## 2021-10-30 DIAGNOSIS — Z6829 Body mass index (BMI) 29.0-29.9, adult: Secondary | ICD-10-CM | POA: Diagnosis not present

## 2021-10-30 DIAGNOSIS — E663 Overweight: Secondary | ICD-10-CM | POA: Diagnosis not present

## 2022-09-17 DIAGNOSIS — E663 Overweight: Secondary | ICD-10-CM | POA: Diagnosis not present

## 2022-09-17 DIAGNOSIS — Z6829 Body mass index (BMI) 29.0-29.9, adult: Secondary | ICD-10-CM | POA: Diagnosis not present

## 2022-09-17 DIAGNOSIS — H6123 Impacted cerumen, bilateral: Secondary | ICD-10-CM | POA: Diagnosis not present

## 2023-01-05 ENCOUNTER — Ambulatory Visit
Admission: EM | Admit: 2023-01-05 | Discharge: 2023-01-05 | Disposition: A | Discharge status: Home / Self Care | Payer: BC Managed Care – PPO | Attending: Emergency Medicine | Admitting: Emergency Medicine

## 2023-01-05 DIAGNOSIS — M5416 Radiculopathy, lumbar region: Secondary | ICD-10-CM

## 2023-01-05 MED ORDER — CYCLOBENZAPRINE HCL 10 MG PO TABS: 10.0000 mg | ORAL_TABLET | Freq: Two times a day (BID) | ORAL | 0 refills | Status: DC | PRN

## 2023-01-05 MED ORDER — PREDNISONE 20 MG PO TABS: 40.0000 mg | ORAL_TABLET | Freq: Every day | ORAL | 0 refills | Status: DC

## 2023-01-05 NOTE — ED Triage Notes (Signed)
Pt c/o left  hip pain x 3 days, pt states he has a hernia in his pelvis near and was told by hospital staff 2 years ago that they wouldn't be able to do anything until it ruptured. Pt states sitting applying pressure walking up stairs, increases pain

## 2023-01-05 NOTE — Discharge Instructions (Signed)
You have been evaluated for a hip pain, based on your examination I do believe your pain is stemming from your back and is not associated with your hernia, on exam unable to feel pelvic hernia and this has a very low chance of being the cause of numbness to your feet however with back pain that can cause nerve compression and will cause pain to radiate down the legs and can be felt in the hip and buttocks  Begin prednisone every morning with food for 5 days to reduce inflammation which in turn will help with your pain, may take Tylenol 500 to 1000 mg every 6 hours in addition to this as needed  You may use Flexeril twice a day as needed for additional comfort, please be used now in full this medication make some people drowsy  Use heat over the affected areas rotating in 10-minute intervals  Place pillows behind the back underneath the leg and between the knees to cushion the body and provide support  As pain lessens begin to massage and stretch the affected area throughout the day  If your pain continues to persist or worsen you have been given a referral to orthopedics, may call and schedule follow-up appointment  Regarding your hernia again do not believe it is the cause of symptoms today but if you have any further concerns or would like treatment for this she may follow-up with general surgery for evaluation for surgical repair

## 2023-01-05 NOTE — ED Provider Notes (Signed)
[facility]    CSN: [id number] Arrival date & time: 01/05/23  1812      History   Chief Complaint No chief complaint on file.   HPI Nicolas George is a 22 y.o. male.   Patient presents for evaluation of left-sided groin and hip pain, left lower back pain present for 3 days.  Pain has been constant, fluctuating in intensity.  Has begun to experience numbness to toes of the left foot only when lying down.  Denies changes in activity, injury or trauma, urinary or bowel incontinence.  Has attempted use of ibuprofen which has been ineffective.  History of a inguinal hernia leave this may be the cause as it is on the same side of current symptoms.      Past Medical History:  Diagnosis Date   Asthma    H/O multiple concussions     Patient Active Problem List   Diagnosis Date Noted   Persistent headaches 11/08/2016   Postconcussion syndrome 07/06/2016    History reviewed. No pertinent surgical history.     Home Medications    Prior to Admission medications   Medication Sig Start Date End Date Taking? Authorizing Provider  acetaminophen (TYLENOL) 325 MG tablet Take 2 tablets (650 mg total) by mouth every 6 (six) hours as needed. 03/20/20   Avegno, Zachery Dakins, FNP  albuterol (VENTOLIN HFA) 108 (90 Base) MCG/ACT inhaler Inhale 2 puffs into the lungs every 4 (four) hours as needed for wheezing or shortness of breath. 12/17/20   Moshe Cipro, NP  fluticasone (FLONASE) 50 MCG/ACT nasal spray Place 2 sprays into both nostrils daily. 07/09/21   Domenick Gong, MD  gabapentin (NEURONTIN) 100 MG capsule Take 1 capsule (100 mg total) by mouth 2 (two) times daily. 03/20/20   Avegno, Zachery Dakins, FNP  ibuprofen (ADVIL) 600 MG tablet Take 1 tablet (600 mg total) by mouth every 6 (six) hours as needed. 07/09/21   Domenick Gong, MD  montelukast (SINGULAIR) 10 MG tablet Take 1 tablet (10 mg total) by mouth at bedtime. 12/17/20   Moshe Cipro, NP  oseltamivir (TAMIFLU)  75 MG capsule Take 1 capsule (75 mg total) by mouth 2 (two) times daily. X 5 days 07/09/21   Domenick Gong, MD  Spacer/Aero-Holding Chambers (AEROCHAMBER PLUS) inhaler Use with inhaler 07/09/21   Domenick Gong, MD  traMADol Janean Sark) 50 MG tablet 1 or 2 po q6h prn pain 12/21/17   Ivery Quale, PA-C    Family History Family History  Problem Relation Age of Onset   Colon cancer Maternal Grandfather    Diabetes Paternal Grandfather    Lymphoma Brother     Social History Social History   Tobacco Use   Smoking status: Every Day    Packs/day: .5    Types: Cigarettes   Smokeless tobacco: Never  Vaping Use   Vaping Use: Never used  Substance Use Topics   Alcohol use: No    Alcohol/week: 0.0 standard drinks of alcohol   Drug use: No     Allergies   Patient has no known allergies.   Review of Systems Review of Systems   Physical Exam Triage Vital Signs ED Triage Vitals  Enc Vitals Group     BP 01/05/23 1818 130/84     Pulse Rate 01/05/23 1818 98     Resp 01/05/23 1818 16     Temp 01/05/23 1818 98.1 F (36.7 C)     Temp Source 01/05/23 1818 Oral     SpO2 01/05/23 1818  97 %     Weight --      Height --      Head Circumference --      Peak Flow --      Pain Score 01/05/23 1823 8     Pain Loc --      Pain Edu? --      Excl. in GC? --    No data found.  Updated Vital Signs BP 130/84 (BP Location: Right Arm)   Pulse 98   Temp 98.1 F (36.7 C) (Oral)   Resp 16   SpO2 97%   Visual Acuity Right Eye Distance:   Left Eye Distance:   Bilateral Distance:    Right Eye Near:   Left Eye Near:    Bilateral Near:     Physical Exam Constitutional:      Appearance: Normal appearance.  Eyes:     Extraocular Movements: Extraocular movements intact.  Pulmonary:     Effort: Pulmonary effort is normal.  Genitourinary:    Comments: No hernia noted on exam Musculoskeletal:     Comments: Tenderness is present to the midline of the lower back and to the left  lumbar aspect without ecchymosis, swelling or deformity, patient sitting on the right buttocks during exam, pain elicited when sitting direct.  Tenderness is present to the lateral aspect of the left hip into the left groin, no swelling or deformity noted, able to bear weight to the lower extremity but pain is elicited, able to complete range of motion to the left hip, 2+ femoral pulse  Neurological:     Mental Status: He is alert.      UC Treatments / Results  Labs (all labs ordered are listed, but only abnormal results are displayed) Labs Reviewed - No data to display  EKG   Radiology No results found.  Procedures Procedures (including critical care time)  Medications Ordered in UC Medications - No data to display  Initial Impression / Assessment and Plan / UC Course  I have reviewed the triage vital signs and the nursing notes.  Pertinent labs & imaging results that were available during my care of the patient were reviewed by me and considered in my medical decision making (see chart for details).   Lumbar back pain with radiculopathy affecting left lower extremity  Hernia not present on exam, low suspicion for cause, discussed with this patient, etiology is most consistent with muscular back pain we will move forward with treatment as such, declined Toradol injection in office, prescribed prednisone and Flexeril for outpatient use, recommended RICE, heat massage stretching and activity as tolerated for additional support, given walking referral to orthopedics for further evaluation and management as needed, also has been given walker referral to general surgery if he would like to follow-up regarding hernia as he endorses a known history Final Clinical Impressions(s) / UC Diagnoses   Final diagnoses:  None   Discharge Instructions   None    ED Prescriptions   None    PDMP not reviewed this encounter.   Valinda Hoar, [place] 01/05/23 [phone]

## 2023-09-17 ENCOUNTER — Ambulatory Visit
Admission: EM | Admit: 2023-09-17 | Discharge: 2023-09-17 | Disposition: A | Discharge status: Home / Self Care | Payer: PRIVATE HEALTH INSURANCE | Attending: Nurse Practitioner | Admitting: Nurse Practitioner

## 2023-09-17 ENCOUNTER — Encounter: Payer: Self-pay | Admitting: Emergency Medicine

## 2023-09-17 DIAGNOSIS — H6592 Unspecified nonsuppurative otitis media, left ear: Secondary | ICD-10-CM | POA: Diagnosis not present

## 2023-09-17 DIAGNOSIS — H6122 Impacted cerumen, left ear: Secondary | ICD-10-CM | POA: Diagnosis not present

## 2023-09-17 MED ORDER — FLUTICASONE PROPIONATE 50 MCG/ACT NA SUSP: 2.0000 | Freq: Every day | NASAL | 0 refills | Status: AC

## 2023-09-17 MED ORDER — AMOXICILLIN-POT CLAVULANATE 875-125 MG PO TABS: 1.0000 | ORAL_TABLET | Freq: Two times a day (BID) | ORAL | 0 refills | Status: AC

## 2023-09-17 NOTE — ED Triage Notes (Signed)
 Left ear pain and can't hear well out of that ear since Tuesday.  Has tried wax remover drops without relief.

## 2023-09-17 NOTE — ED Provider Notes (Signed)
 [facility]    CSN: [id number] Arrival date & time: 09/17/23  0803      History   Chief Complaint No chief complaint on file.   HPI Nicolas George is a 23 y.o. male.   The history is provided by the patient.   Patient presents for complaints of left ear pain, ringing in the left ear, and decreased hearing is been present for the past several days.  Patient reports before symptoms started, he did have a URI.  Patient states that he has tried earwax remover drops with minimal relief.  Denies fever, chills, ear drainage, dizziness or lightheadedness.  Patient states he had the same or similar problem in the past, states that his PCP performed ear irrigation which helped relieve his symptoms.  Past Medical History:  Diagnosis Date   Asthma    H/O multiple concussions     Patient Active Problem List   Diagnosis Date Noted   Persistent headaches 11/08/2016   Postconcussion syndrome 07/06/2016    History reviewed. No pertinent surgical history.     Home Medications    Prior to Admission medications   Medication Sig Start Date End Date Taking? Authorizing Provider  amoxicillin-clavulanate (AUGMENTIN) 875-125 MG tablet Take 1 tablet by mouth every 12 (twelve) hours. 09/17/23  Yes Leath-Warren, Sadie Haber, NP  fluticasone (FLONASE) 50 MCG/ACT nasal spray Place 2 sprays into both nostrils daily. 09/17/23  Yes Leath-Warren, Sadie Haber, NP  acetaminophen (TYLENOL) 325 MG tablet Take 2 tablets (650 mg total) by mouth every 6 (six) hours as needed. 03/20/20   Avegno, Zachery Dakins, FNP  albuterol (VENTOLIN HFA) 108 (90 Base) MCG/ACT inhaler Inhale 2 puffs into the lungs every 4 (four) hours as needed for wheezing or shortness of breath. 12/17/20   Moshe Cipro, FNP  ibuprofen (ADVIL) 600 MG tablet Take 1 tablet (600 mg total) by mouth every 6 (six) hours as needed. 07/09/21   Domenick Gong, MD  Spacer/Aero-Holding Chambers (AEROCHAMBER PLUS) inhaler Use with inhaler  07/09/21   Domenick Gong, MD    Family History Family History  Problem Relation Age of Onset   Colon cancer Maternal Grandfather    Diabetes Paternal Grandfather    Lymphoma Brother     Social History Social History   Tobacco Use   Smoking status: Every Day    Current packs/day: 0.50    Types: Cigarettes   Smokeless tobacco: Never  Vaping Use   Vaping status: Never Used  Substance Use Topics   Alcohol use: No    Alcohol/week: 0.0 standard drinks of alcohol   Drug use: No     Allergies   Patient has no known allergies.   Review of Systems Review of Systems Per HPI  Physical Exam Triage Vital Signs ED Triage Vitals  Encounter Vitals Group     BP 09/17/23 0816 (!) 147/92     Systolic BP Percentile --      Diastolic BP Percentile --      Pulse Rate 09/17/23 0816 61     Resp 09/17/23 0816 18     Temp 09/17/23 0816 98.6 F (37 C)     Temp Source 09/17/23 0816 Oral     SpO2 09/17/23 0816 97 %     Weight --      Height --      Head Circumference --      Peak Flow --      Pain Score 09/17/23 0817 2     Pain Loc --  Pain Education --      Exclude from Growth Chart --    No data found.  Updated Vital Signs BP (!) 147/92 (BP Location: Right Arm)   Pulse 61   Temp 98.6 F (37 C) (Oral)   Resp 18   SpO2 97%   Visual Acuity Right Eye Distance:   Left Eye Distance:   Bilateral Distance:    Right Eye Near:   Left Eye Near:    Bilateral Near:     Physical Exam Vitals and nursing note reviewed.  Constitutional:      General: He is not in acute distress.    Appearance: Normal appearance.  HENT:     Head: Normocephalic.     Right Ear: Tympanic membrane, ear canal and external ear normal.     Left Ear: Ear canal and external ear normal. A middle ear effusion is present. There is impacted cerumen.     Ears:     Comments: Ear irrigation performed with complete evacuation of cerumen impaction.  Post cerumen impaction, able to visualize erythematous  and bulging TM in the left ear.    Nose: Nose normal.     Right Turbinates: Enlarged and swollen.     Left Turbinates: Enlarged and swollen.     Right Sinus: No maxillary sinus tenderness or frontal sinus tenderness.     Left Sinus: No maxillary sinus tenderness or frontal sinus tenderness.     Mouth/Throat:     Mouth: Mucous membranes are moist.     Pharynx: No posterior oropharyngeal erythema.  Eyes:     Extraocular Movements: Extraocular movements intact.     Pupils: Pupils are equal, round, and reactive to light.  Pulmonary:     Effort: Pulmonary effort is normal.  Skin:    General: Skin is warm and dry.  Neurological:     General: No focal deficit present.     Mental Status: He is alert and oriented to person, place, and time.  Psychiatric:        Mood and Affect: Mood normal.        Behavior: Behavior normal.      UC Treatments / Results  Labs (all labs ordered are listed, but only abnormal results are displayed) Labs Reviewed - No data to display  EKG   Radiology No results found.  Procedures Procedures (including critical care time)  Medications Ordered in UC Medications - No data to display  Initial Impression / Assessment and Plan / UC Course  I have reviewed the triage vital signs and the nursing notes.  Pertinent labs & imaging results that were available during my care of the patient were reviewed by me and considered in my medical decision making (see chart for details).  Exam, lung sounds are clear throughout, room air sats at 97%.  Patient with complaints of feeling like the left ear is clogged with tinnitus over the past several days.  Cerumen impaction present, post ear irrigation, patient with acute otitis media with effusion of the left ear.  Will start patient on Augmentin 875/125 mg tablets and fluticasone 50 micro nasal spray for the left middle ear effusion.  Supportive care recommendations were provided and discussed with the patient to include  fluids, rest, over-the-counter analgesics, and warm compresses to the ear.  Patient was advised to follow-up if symptoms fail to improve.  Patient was in agreement with this plan of care and verbalizes understanding.  All questions were answered.  Patient stable for discharge.  Final Clinical Impressions(s) / UC Diagnoses   Final diagnoses:  Impacted cerumen of left ear  Left otitis media with effusion     Discharge Instructions      Take medication as prescribed. May take Ibuprofen or Tylenol for pain, fever, or general discomfort. Warm compresses to the affected ear help with comfort. Do not stick anything inside the ear while symptoms persist. Avoid getting water inside of the ear while symptoms persist. Follow-up if symptoms do not improve.      ED Prescriptions     Medication Sig Dispense Auth. Provider   amoxicillin-clavulanate (AUGMENTIN) 875-125 MG tablet Take 1 tablet by mouth every 12 (twelve) hours. 14 tablet Leath-Warren, Sadie Haber, NP   fluticasone (FLONASE) 50 MCG/ACT nasal spray Place 2 sprays into both nostrils daily. 16 g Leath-Warren, Sadie Haber, NP      PDMP not reviewed this encounter.   Abran Cantor, NP 09/17/23 [phone]

## 2023-09-17 NOTE — Discharge Instructions (Addendum)
 Take medication as prescribed. May take Ibuprofen or Tylenol for pain, fever, or general discomfort. Warm compresses to the affected ear help with comfort. Do not stick anything inside the ear while symptoms persist. Avoid getting water inside of the ear while symptoms persist. Follow-up if symptoms do not improve.
